# Patient Record
Sex: Male | Born: 2000 | Hispanic: No | Marital: Single | Smoking: Never smoker
Health system: Southern US, Community
[De-identification: ages and names within clinical notes are randomized; demographics above are authoritative.]

---
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
## 2022-01-29 ENCOUNTER — Encounter: Payer: Self-pay | Admitting: Family Medicine

## 2022-01-29 ENCOUNTER — Ambulatory Visit (INDEPENDENT_AMBULATORY_CARE_PROVIDER_SITE_OTHER): Payer: BC Managed Care – PPO | Admitting: Family Medicine

## 2022-01-29 DIAGNOSIS — Z0000 Encounter for general adult medical examination without abnormal findings: Secondary | ICD-10-CM | POA: Diagnosis not present

## 2022-01-29 NOTE — Progress Notes (Signed)
Hector Johnson - 21 y.o. male MRN [id number]  Date of birth: [birth year]  Subjective No chief complaint on file.   HPI Hector Johnson is a 21 y.o. male here today for annual exam.  He reports that he is doing well today.  He has no new concerns.   Currently attending [employer].  Working at [employer] over the summer.   He is exercising regularly.  Feels like diet is pretty good.    Non-smoker.  Denies EtOH use.    Immunizations are UTD.    No Known Allergies  No past medical history on file.  No past surgical history on file.  Review of Systems  Constitutional:  Negative for chills, fever, malaise/fatigue and weight loss.  HENT:  Negative for congestion, ear pain and sore throat.   Eyes:  Negative for blurred vision, double vision and pain.  Respiratory:  Negative for cough and shortness of breath.   Cardiovascular:  Negative for chest pain and palpitations.  Gastrointestinal:  Negative for abdominal pain, blood in stool, constipation, heartburn and nausea.  Genitourinary:  Negative for dysuria and urgency.  Musculoskeletal:  Negative for joint pain and myalgias.  Neurological:  Negative for dizziness and headaches.  Endo/Heme/Allergies:  Does not bruise/bleed easily.  Psychiatric/Behavioral:  Negative for depression. The patient is not nervous/anxious and does not have insomnia.      Social History   Socioeconomic History   Marital status: Single    Spouse name: Not on file   Number of children: Not on file   Years of education: Not on file   Highest education level: Not on file  Occupational History   Not on file  Tobacco Use   Smoking status: Never   Smokeless tobacco: Never  Substance and Sexual Activity   Alcohol use: No   Drug use: No   Sexual activity: Never  Other Topics Concern   Not on file  Social History Narrative   Not on file   Social Determinants of Health   Financial Resource Strain: Not on file   Food Insecurity: Not on file  Transportation Needs: Not on file  Physical Activity: Not on file  Stress: Not on file  Social Connections: Not on file    No family history on file.  Health Maintenance  Topic Date Due   HIV Screening  Never done   Hepatitis C Screening  Never done   INFLUENZA VACCINE  02/17/2022   TETANUS/TDAP  12/03/2024   HPV VACCINES  Completed     ----------------------------------------------------------------------------------------------------------------------------------------------------------------------------------------------------------------- Physical Exam BP 108/72 (BP Location: Left Arm)   Pulse (!) 58   Ht 5' 10.28" (1.785 m)   Wt 152 lb 14.4 oz (69.4 kg)   SpO2 96%   BMI 21.77 kg/m   Physical Exam Constitutional:      General: He is not in acute distress. HENT:     Head: Normocephalic and atraumatic.     Right Ear: Tympanic membrane and external ear normal.     Left Ear: Tympanic membrane and external ear normal.  Eyes:     General: No scleral icterus. Neck:     Thyroid: No thyromegaly.  Cardiovascular:     Rate and Rhythm: Normal rate and regular rhythm.     Heart sounds: Normal heart sounds.  Pulmonary:     Effort: Pulmonary effort is normal.     Breath sounds: Normal breath sounds.  Abdominal:     General: Bowel sounds are normal. There is  no distension.     Palpations: Abdomen is soft.     Tenderness: There is no abdominal tenderness. There is no guarding.  Musculoskeletal:     Cervical back: Normal range of motion.  Lymphadenopathy:     Cervical: No cervical adenopathy.  Skin:    General: Skin is warm and dry.     Findings: No rash.  Neurological:     Mental Status: He is alert and oriented to person, place, and time.     Cranial Nerves: No cranial nerve deficit.     Motor: No abnormal muscle tone.  Psychiatric:        Mood and Affect: Mood normal.        Behavior: Behavior normal.      ------------------------------------------------------------------------------------------------------------------------------------------------------------------------------------------------------------------- Assessment and Plan  Well adult exam Well adult Immunizations: UTD Screenings: UTD Anticipatory guidance/Risk factor Reduction:  Recommendations per AVS.     No orders of the defined types were placed in this encounter.   No follow-ups on file.    This visit occurred during the SARS-CoV-2 public health emergency.  Safety protocols were in place, including screening questions prior to the visit, additional usage of staff PPE, and extensive cleaning of exam room while observing appropriate contact time as indicated for disinfecting solutions.

## 2022-01-29 NOTE — Assessment & Plan Note (Signed)
Well adult Immunizations: UTD Screenings: UTD Anticipatory guidance/Risk factor Reduction:  Recommendations per AVS.

## 2022-01-29 NOTE — Patient Instructions (Signed)
Preventive Care 18-19 Years Old, Male ?Preventive care refers to lifestyle choices and visits with your health care provider that can promote health and wellness. At this stage in your life, you may start seeing a primary care physician instead of a pediatrician for your preventive care. Preventive care visits are also called wellness exams. ?What can I expect for my preventive care visit? ?Counseling ?During your preventive care visit, your health care provider may ask about your: ?Medical history, including: ?Past medical problems. ?Family medical history. ?Current health, including: ?Home life and relationship well-being. ?Emotional well-being. ?Sexual activity and sexual health. ?Lifestyle, including: ?Alcohol, nicotine or tobacco, and drug use. ?Access to firearms. ?Diet, exercise, and sleep habits. ?Sunscreen use. ?Motor vehicle safety. ?Physical exam ?Your health care provider may check your: ?Height and weight. These may be used to calculate your BMI (body mass index). BMI is a measurement that tells if you are at a healthy weight. ?Waist circumference. This measures the distance around your waistline. This measurement also tells if you are at a healthy weight and may help predict your risk of certain diseases, such as type 2 diabetes and high blood pressure. ?Heart rate and blood pressure. ?Body temperature. ?Skin for abnormal spots. ?What immunizations do I need? ? ?Vaccines are usually given at various ages, according to a schedule. Your health care provider will recommend vaccines for you based on your age, medical history, and lifestyle or other factors, such as travel or where you work. ?What tests do I need? ?Screening ?Your health care provider may recommend screening tests for certain conditions. This may include: ?Vision and hearing tests. ?Lipid and cholesterol levels. ?Hepatitis B test. ?Hepatitis C test. ?HIV (human immunodeficiency virus) test. ?STI (sexually transmitted infection) testing, if  you are at risk. ?Tuberculosis skin test. ?Talk with your health care provider about your test results, treatment options, and if necessary, the need for more tests. ?Follow these instructions at home: ?Eating and drinking ? ?Eat a healthy diet that includes fresh fruits and vegetables, whole grains, lean protein, and low-fat dairy products. ?Drink enough fluid to keep your urine pale yellow. ?Do not drink alcohol if: ?Your health care provider tells you not to drink. ?You are under the legal drinking age. In the [place] the legal drinking age is 21. ?If you drink alcohol: ?Limit how much you have to 0-2 drinks a day. ?Know how much alcohol is in your drink. In the [place] one drink equals one 12 oz bottle of beer (355 mL), one 5 oz glass of wine (148 mL), or one 1? oz glass of hard liquor (44 mL). ?Lifestyle ?Brush your teeth every morning and night with fluoride toothpaste. Floss one time each day. ?Exercise for at least 30 minutes 5 or more days of the week. ?Do not use any products that contain nicotine or tobacco. These products include cigarettes, chewing tobacco, and vaping devices, such as e-cigarettes. If you need help quitting, ask your health care provider. ?Do not use drugs. ?If you are sexually active, practice safe sex. Use a condom or other form of protection to prevent STIs. ?Find healthy ways to manage stress, such as: ?Meditation, yoga, or listening to music. ?Journaling. ?Talking to a trusted person. ?Spending time with friends and family. ?Safety ?Always wear your seat belt while driving or riding in a vehicle. ?Do not drive: ?If you have been drinking alcohol. Do not ride with someone who has been drinking. ?When you are tired or distracted. ?While texting. ?If you have been using   any mind-altering substances or drugs. ?Wear a helmet and other protective equipment during sports activities. ?If you have firearms in your house, make sure you follow all gun safety procedures. ?Seek help if you have  been bullied, physically abused, or sexually abused. ?Use the internet responsibly to avoid dangers, such as online bullying and online sex predators. ?What's next? ?Go to your health care provider once a year for an annual wellness visit. ?Ask your health care provider how often you should have your eyes and teeth checked. ?Stay up to date on all vaccines. ?This information is not intended to replace advice given to you by your health care provider. Make sure you discuss any questions you have with your health care provider. ?Document Revised: 01/01/2021 Document Reviewed: 01/01/2021 ?[employer] Patient Education ? 2023 [employer] ? ?

## 2022-03-04 DIAGNOSIS — R070 Pain in throat: Secondary | ICD-10-CM | POA: Diagnosis not present

## 2022-03-04 DIAGNOSIS — J029 Acute pharyngitis, unspecified: Secondary | ICD-10-CM | POA: Diagnosis not present

## 2022-09-07 DIAGNOSIS — J00 Acute nasopharyngitis [common cold]: Secondary | ICD-10-CM | POA: Diagnosis not present

## 2022-10-17 IMAGING — DX DG ANKLE COMPLETE 3+V*R*
3 series · 3 of 3 positions shown · non-contrast
Comparison: None.

CLINICAL DATA: Right ankle pain after basketball injury.

EXAM:
RIGHT ANKLE - COMPLETE 3+ VIEW

[ankle ap]
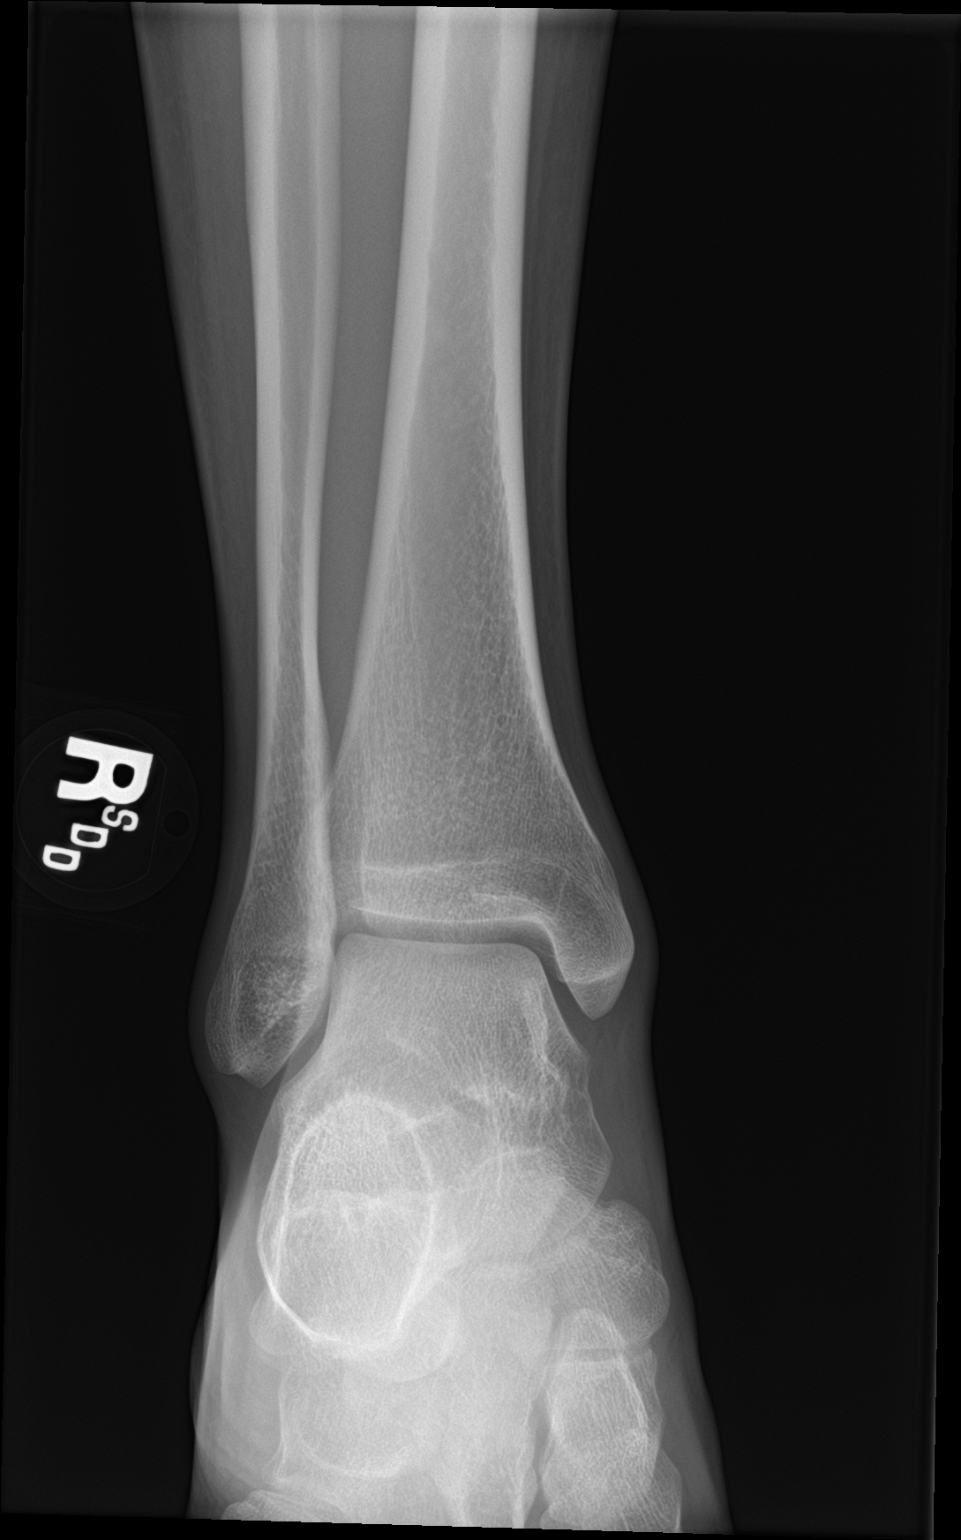

[ankle lat]
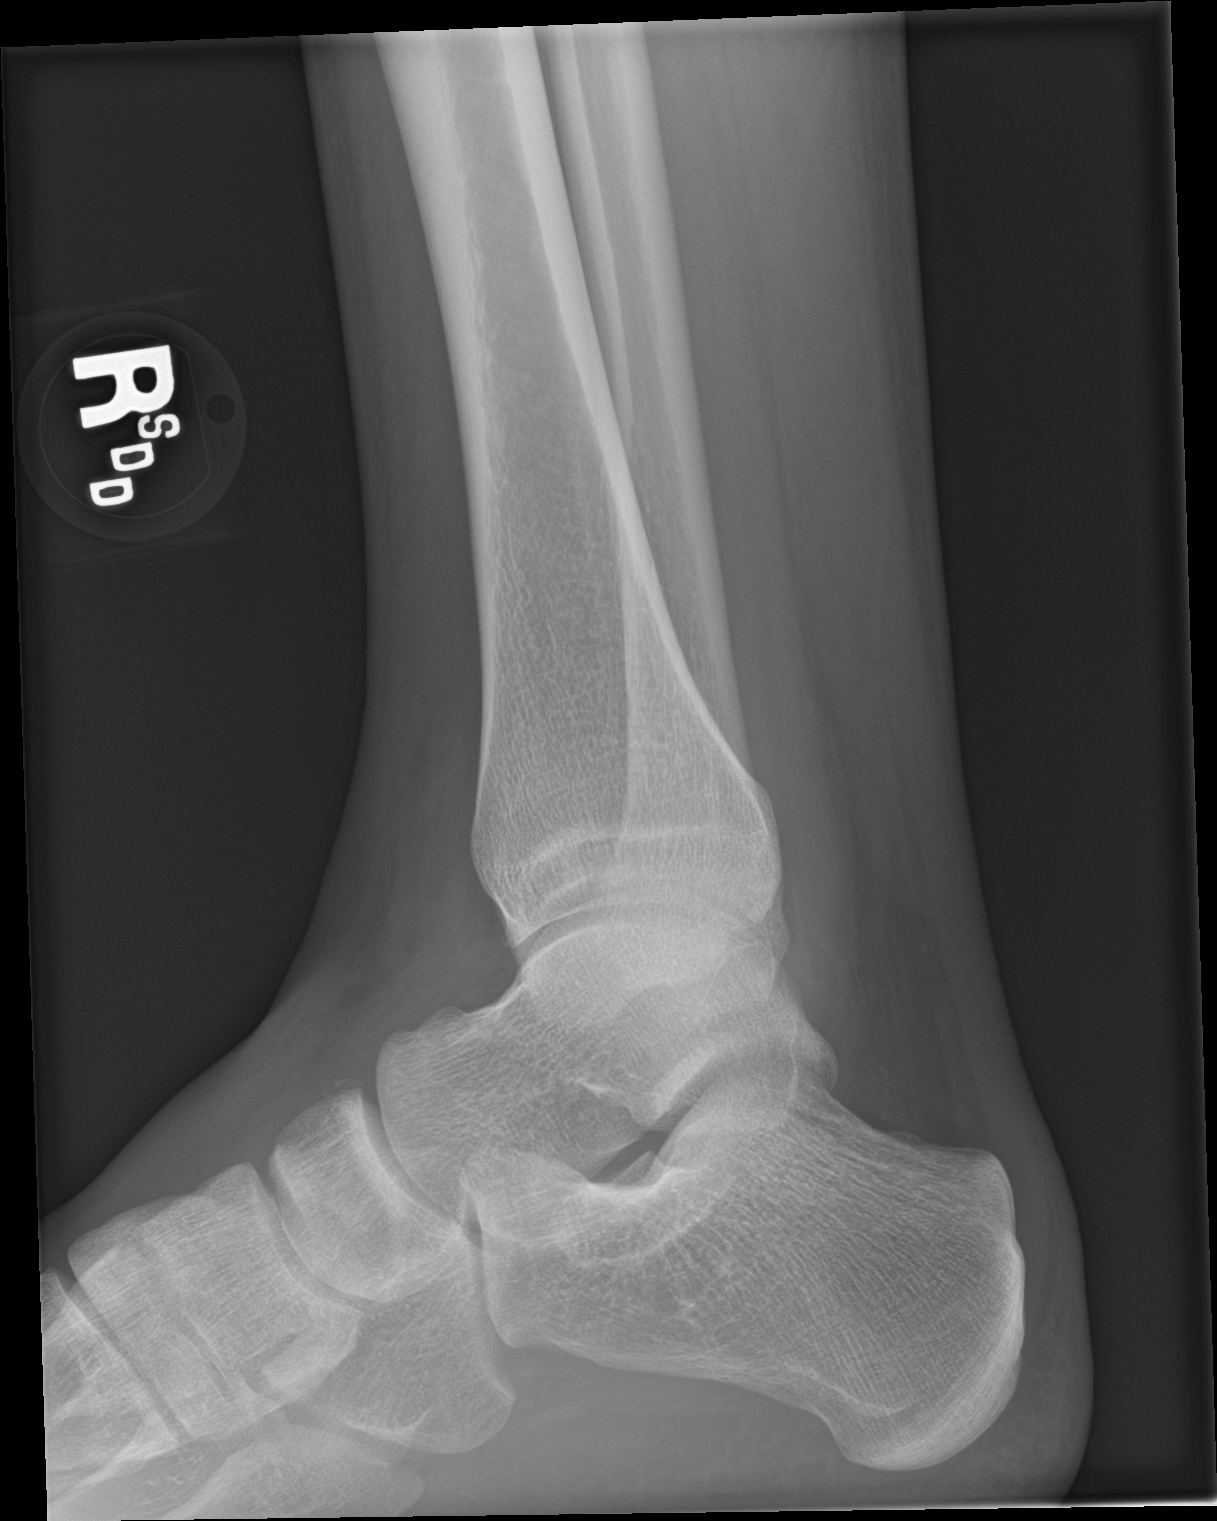

[ankle obl]
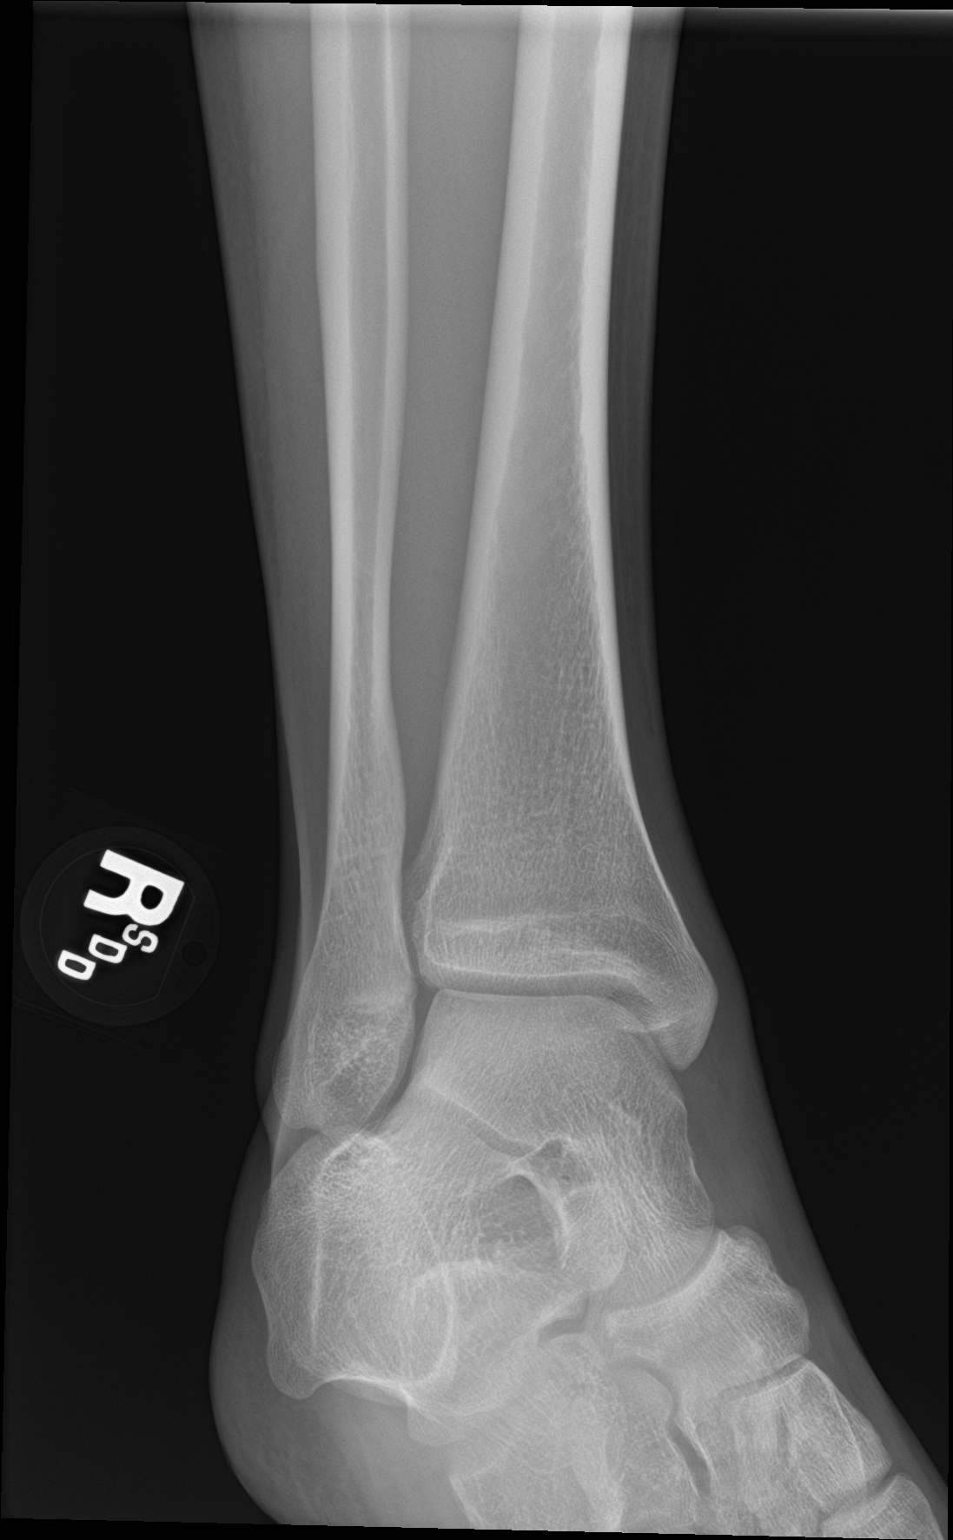

[3 of 3 positions shown; findings below may reference images not displayed]

FINDINGS: There is no evidence of fracture, dislocation, or joint effusion.
There is no evidence of arthropathy or other focal bone abnormality.
Soft tissues are unremarkable.
IMPRESSION: Negative.

## 2023-01-20 DIAGNOSIS — R300 Dysuria: Secondary | ICD-10-CM | POA: Diagnosis not present

## 2023-01-20 DIAGNOSIS — Z113 Encounter for screening for infections with a predominantly sexual mode of transmission: Secondary | ICD-10-CM | POA: Diagnosis not present
